# Patient Record
Sex: Male | Born: 1980 | Race: Black or African American | Hispanic: No | Marital: Single | Smoking: Current every day smoker
Health system: Southern US, Community
[De-identification: ages and names within clinical notes are randomized; demographics above are authoritative.]

---

## 2004-09-22 ENCOUNTER — Emergency Department: Payer: Self-pay | Admitting: General Practice

## 2005-02-23 ENCOUNTER — Emergency Department: Payer: Self-pay | Admitting: General Practice

## 2006-11-24 ENCOUNTER — Emergency Department: Payer: Self-pay | Admitting: Emergency Medicine

## 2011-04-10 ENCOUNTER — Emergency Department: Payer: Self-pay | Admitting: Internal Medicine

## 2011-10-04 ENCOUNTER — Emergency Department: Payer: Self-pay | Admitting: Emergency Medicine

## 2012-01-09 ENCOUNTER — Emergency Department: Payer: Self-pay | Admitting: Emergency Medicine

## 2012-01-09 LAB — COMPREHENSIVE METABOLIC PANEL
Calcium, Total: 8.1 mg/dL — ABNORMAL LOW (ref 8.5–10.1)
EGFR (Non-African Amer.): >60
Glucose: 96 mg/dL (ref 65–99)
Osmolality: 276 (ref 275–301)
SGPT (ALT): 21 U/L

## 2012-01-09 LAB — TROPONIN I
Troponin-I: <0.02 ng/mL
Troponin-I: 0.02 ng/mL

## 2012-01-09 LAB — CBC
HGB: 14.3 g/dL (ref 13.0–18.0)
MCH: 28.1 pg (ref 26.0–34.0)
MCHC: 32.9 g/dL (ref 32.0–36.0)
MCV: 85 fL (ref 80–100)
WBC: 7.7 10*3/uL (ref 3.8–10.6)

## 2013-06-05 ENCOUNTER — Emergency Department: Payer: Self-pay | Admitting: Emergency Medicine

## 2013-09-28 ENCOUNTER — Emergency Department: Payer: Self-pay | Admitting: Emergency Medicine

## 2014-02-17 ENCOUNTER — Emergency Department: Payer: Self-pay | Admitting: Emergency Medicine
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
## 2018-08-25 ENCOUNTER — Other Ambulatory Visit: Payer: Self-pay

## 2018-08-25 ENCOUNTER — Emergency Department
Admission: EM | Admit: 2018-08-25 | Discharge: 2018-08-25 | Disposition: A | Discharge status: Home / Self Care | Payer: Self-pay | Attending: Emergency Medicine | Admitting: Emergency Medicine

## 2018-08-25 ENCOUNTER — Encounter: Payer: Self-pay | Admitting: Emergency Medicine

## 2018-08-25 ENCOUNTER — Emergency Department: Payer: Self-pay

## 2018-08-25 DIAGNOSIS — S63501A Unspecified sprain of right wrist, initial encounter: Secondary | ICD-10-CM | POA: Insufficient documentation

## 2018-08-25 DIAGNOSIS — Y929 Unspecified place or not applicable: Secondary | ICD-10-CM | POA: Insufficient documentation

## 2018-08-25 DIAGNOSIS — Y939 Activity, unspecified: Secondary | ICD-10-CM | POA: Insufficient documentation

## 2018-08-25 DIAGNOSIS — W19XXXA Unspecified fall, initial encounter: Secondary | ICD-10-CM | POA: Insufficient documentation

## 2018-08-25 DIAGNOSIS — F17200 Nicotine dependence, unspecified, uncomplicated: Secondary | ICD-10-CM | POA: Insufficient documentation

## 2018-08-25 DIAGNOSIS — Y998 Other external cause status: Secondary | ICD-10-CM | POA: Insufficient documentation

## 2018-08-25 MED ORDER — TRAMADOL HCL 50 MG PO TABS: 50.0000 mg | ORAL_TABLET | Freq: Four times a day (QID) | ORAL | 0 refills | Status: AC | PRN

## 2018-08-25 MED ORDER — NAPROXEN 500 MG PO TABS
500.0000 mg | ORAL_TABLET | Freq: Once | ORAL | Status: AC
  Administered 2018-08-25: 500 mg via ORAL
  Filled 2018-08-25: qty 1

## 2018-08-25 MED ORDER — NAPROXEN 500 MG PO TABS: 500.0000 mg | ORAL_TABLET | Freq: Two times a day (BID) | ORAL | Status: AC

## 2018-08-25 NOTE — ED Triage Notes (Signed)
Pt to ED via POV, pt states that he fell about 2 hours PTA, pt tried to break his fall with his right arm and now he is having pain and swelling in the right arm. Pt is in NAD

## 2018-08-25 NOTE — ED Provider Notes (Signed)
[facility] Emergency Department Provider Note   ____________________________________________   First MD Initiated Contact with Patient 08/25/18 1734     (approximate)  I have reviewed the triage vital signs and the nursing notes.   HISTORY  Chief Complaint Fall    HPI Jonathan Davenport is a 37 y.o. male patient complain right wrist and forearm pain secondary to break in a fall 2 hours prior to arrival.  Patient state he has decreased range of motion with extension and flexion of the right wrist.  Patient denies loss of sensation.  Patient rates his pain as a 8/10.  Patient described the pain is "achy".  No palliative measures prior to arrival.  Patient is right-hand dominant.  No past medical history on file.  There are no active problems to display for this patient.     Prior to Admission medications   Medication Sig Start Date End Date Taking? Authorizing Provider  naproxen (NAPROSYN) 500 MG tablet Take 1 tablet (500 mg total) by mouth 2 (two) times daily with a meal. 08/25/18   Joni ReiningSmith,  K, PA-C  traMADol (ULTRAM) 50 MG tablet Take 1 tablet (50 mg total) by mouth every 6 (six) hours as needed. 08/25/18 08/25/19  Joni ReiningSmith,  K, PA-C    Allergies Patient has no known allergies.  No family history on file.  Social History Social History   Tobacco Use  . Smoking status: Current Every Day Smoker  . Smokeless tobacco: Never Used  Substance Use Topics  . Alcohol use: Yes  . Drug use: Yes    Review of Systems  Constitutional: No fever/chills Eyes: No visual changes. ENT: No sore throat. Cardiovascular: Denies chest pain. Respiratory: Denies shortness of breath. Gastrointestinal: No abdominal pain.  No nausea, no vomiting.  No diarrhea.  No constipation. Genitourinary: Negative for dysuria. Musculoskeletal: Right wrist pain. Skin: Negative for rash. Neurological: Negative for headaches, focal weakness or  numbness.   ____________________________________________   PHYSICAL EXAM:  VITAL SIGNS: ED Triage Vitals  Enc Vitals Group     BP 08/25/18 1719 (!) 133/107     Pulse Rate 08/25/18 1719 92     Resp 08/25/18 1719 16     Temp 08/25/18 1719 98.5 F (36.9 C)     Temp Source 08/25/18 1719 Oral     SpO2 08/25/18 1719 96 %     Weight 08/25/18 1720 140 lb (63.5 kg)     Height 08/25/18 1720 5\' 9"  (1.753 m)     Head Circumference --      Peak Flow --      Pain Score 08/25/18 1720 8     Pain Loc --      Pain Edu? --      Excl. in GC? --    Constitutional: Alert and oriented. Well appearing and in no acute distress. Cardiovascular: Normal rate, regular rhythm. Grossly normal heart sounds.  Good peripheral circulation. Respiratory: Normal respiratory effort.  No retractions. Lungs CTAB. Musculoskeletal: No obvious deformity to the right wrist.  Patient decreased range of motion limited by complaint of pain with flexion extension. Neurologic:  Normal speech and language. No gross focal neurologic deficits are appreciated. No gait instability. Skin:  Skin is warm, dry and intact. No rash noted.  Abrasion to right elbow. Psychiatric: Mood and affect are normal. Speech and behavior are normal.  ____________________________________________   LABS (all labs ordered are listed, but only abnormal results are displayed)  Labs Reviewed - No data to display  ____________________________________________  EKG   ____________________________________________  RADIOLOGY  ED MD interpretation:    Official radiology report(s): Dg Wrist Complete Right  Result Date: 08/25/2018 CLINICAL DATA:  Right wrist pain secondary to a fall.  Swelling. EXAM: RIGHT WRIST - COMPLETE 3+ VIEW COMPARISON:  None. FINDINGS: There is no evidence of fracture or dislocation. There is no evidence of arthropathy or other focal bone abnormality. Soft tissues are unremarkable. IMPRESSION: Negative. Electronically Signed    By: Francene BoyersJames  Maxwell M.D.   On: 08/25/2018 18:33    ____________________________________________   PROCEDURES  Procedure(s) performed: None  Procedures  Critical Care performed: No  ____________________________________________   INITIAL IMPRESSION / ASSESSMENT AND PLAN / ED COURSE  As part of my medical decision making, I reviewed the following data within the electronic MEDICAL RECORD NUMBER    Right wrist pain secondary to strain.  Discussed x-ray findings with patient.  Patient placed in a wrist splint and given discharge care instruction.  Patient advised take medication as needed and [facility] if [facility].      ____________________________________________   FINAL CLINICAL IMPRESSION(S) / ED DIAGNOSES  Final diagnoses:  Sprain of right wrist, initial encounter     ED Discharge Orders         Ordered    naproxen (NAPROSYN) 500 MG tablet  2 times daily with meals     08/25/18 1830    traMADol (ULTRAM) 50 MG tablet  Every 6 hours PRN     08/25/18 1830           Note:  This document was prepared using Dragon voice recognition software and may include unintentional dictation errors.    Joni ReiningSmith,  K, PA-C 08/25/18 1845    Nita SickleVeronese, [place], MD 08/25/18 2039
